# Patient Record
Sex: Male | Born: 1990 | Hispanic: Yes | Smoking: Light tobacco smoker
Health system: Southern US, Community
[De-identification: ages and names within clinical notes are randomized; demographics above are authoritative.]

## PROBLEM LIST (undated history)

## (undated) DIAGNOSIS — F1110 Opioid abuse, uncomplicated: Secondary | ICD-10-CM

## (undated) DIAGNOSIS — I639 Cerebral infarction, unspecified: Secondary | ICD-10-CM

## (undated) DIAGNOSIS — T50901A Poisoning by unspecified drugs, medicaments and biological substances, accidental (unintentional), initial encounter: Secondary | ICD-10-CM

## (undated) DIAGNOSIS — M21372 Foot drop, left foot: Secondary | ICD-10-CM

---

## 2016-12-16 DIAGNOSIS — M21372 Foot drop, left foot: Secondary | ICD-10-CM

## 2016-12-16 DIAGNOSIS — T50901A Poisoning by unspecified drugs, medicaments and biological substances, accidental (unintentional), initial encounter: Secondary | ICD-10-CM

## 2016-12-16 HISTORY — DX: Poisoning by unspecified drugs, medicaments and biological substances, accidental (unintentional), initial encounter: T50.901A

## 2016-12-16 HISTORY — DX: Foot drop, left foot: M21.372

## 2018-09-04 ENCOUNTER — Emergency Department (HOSPITAL_COMMUNITY): Payer: Self-pay

## 2018-09-04 ENCOUNTER — Emergency Department (HOSPITAL_COMMUNITY)
Admission: EM | Admit: 2018-09-04 | Discharge: 2018-09-04 | Disposition: A | Discharge status: Home / Self Care | Payer: Self-pay | Attending: Emergency Medicine | Admitting: Emergency Medicine

## 2018-09-04 ENCOUNTER — Encounter (HOSPITAL_COMMUNITY): Payer: Self-pay

## 2018-09-04 ENCOUNTER — Other Ambulatory Visit: Payer: Self-pay

## 2018-09-04 DIAGNOSIS — M19072 Primary osteoarthritis, left ankle and foot: Secondary | ICD-10-CM | POA: Insufficient documentation

## 2018-09-04 DIAGNOSIS — Z87891 Personal history of nicotine dependence: Secondary | ICD-10-CM | POA: Insufficient documentation

## 2018-09-04 HISTORY — DX: Foot drop, left foot: M21.372

## 2018-09-04 HISTORY — DX: Poisoning by unspecified drugs, medicaments and biological substances, accidental (unintentional), initial encounter: T50.901A

## 2018-09-04 LAB — CBC WITH DIFFERENTIAL/PLATELET
ABS IMMATURE GRANULOCYTES: 0 10*3/uL (ref 0.0–0.1)
BASOS PCT: 0 %
Basophils Absolute: 0 10*3/uL (ref 0.0–0.1)
Eosinophils Absolute: 0.8 10*3/uL — ABNORMAL HIGH (ref 0.0–0.7)
Eosinophils Relative: 12 %
HEMATOCRIT: 38.9 % — AB (ref 39.0–52.0)
Hemoglobin: 12.7 g/dL — ABNORMAL LOW (ref 13.0–17.0)
Immature Granulocytes: 0 %
LYMPHS ABS: 1.2 10*3/uL (ref 0.7–4.0)
Lymphocytes Relative: 19 %
MCH: 30 pg (ref 26.0–34.0)
MCHC: 32.6 g/dL (ref 30.0–36.0)
MCV: 92 fL (ref 78.0–100.0)
MONO ABS: 0.5 10*3/uL (ref 0.1–1.0)
MONOS PCT: 8 %
NEUTROS ABS: 4 10*3/uL (ref 1.7–7.7)
Neutrophils Relative %: 61 %
PLATELETS: 335 10*3/uL (ref 150–400)
RBC: 4.23 MIL/uL (ref 4.22–5.81)
RDW: 12.5 % (ref 11.5–15.5)
WBC: 6.5 10*3/uL (ref 4.0–10.5)

## 2018-09-04 LAB — COMPREHENSIVE METABOLIC PANEL
ALBUMIN: 3.4 g/dL — AB (ref 3.5–5.0)
ALK PHOS: 73 U/L (ref 38–126)
ALT: 17 U/L (ref 0–44)
ANION GAP: 7 (ref 5–15)
AST: 23 U/L (ref 15–41)
BUN: <5 mg/dL — ABNORMAL LOW (ref 6–20)
CALCIUM: 8.8 mg/dL — AB (ref 8.9–10.3)
CO2: 28 mmol/L (ref 22–32)
CREATININE: 0.57 mg/dL — AB (ref 0.61–1.24)
Chloride: 104 mmol/L (ref 98–111)
GFR calc Af Amer: >60 mL/min (ref >60)
GFR calc non Af Amer: >60 mL/min (ref >60)
GLUCOSE: 86 mg/dL (ref 70–99)
Potassium: 3.1 mmol/L — ABNORMAL LOW (ref 3.5–5.1)
SODIUM: 139 mmol/L (ref 135–145)
Total Bilirubin: 0.5 mg/dL (ref 0.3–1.2)
Total Protein: 6.2 g/dL — ABNORMAL LOW (ref 6.5–8.1)

## 2018-09-04 LAB — I-STAT CG4 LACTIC ACID, ED: Lactic Acid, Venous: 1.06 mmol/L (ref 0.5–1.9)

## 2018-09-04 MED ORDER — PREDNISONE 20 MG PO TABS: 20.0000 mg | ORAL_TABLET | Freq: Two times a day (BID) | ORAL | 0 refills | Status: DC

## 2018-09-04 NOTE — ED Notes (Signed)
Signature pad not working. Pt ambulatory and no s/sx distress noted

## 2018-09-04 NOTE — ED Triage Notes (Signed)
Pt c/o left foot swelling X1 week. Foot is red and warm to touch, denies injury, no visible trauma seen. Pt has strong pulses in left foot but reports numbness and foot drop X1 year as result of an OD. Pt endorse still using opiates, snorts, last IVDU in right arm X3 weeks ago.

## 2018-09-04 NOTE — Discharge Instructions (Signed)
Use the walking boot as needed for comfort.  Call the orthopedic doctor or podiatrist, for follow-up appointment to arrange for further treatment in 1 or 2 weeks.  After the prednisone runs out, use ibuprofen 400 mg 3 times a day as needed for pain.  You will also find that elevating the left foot above your heart and using heat on it several times a day will be beneficial.

## 2018-09-04 NOTE — ED Notes (Signed)
Transported to xray 

## 2018-09-04 NOTE — ED Provider Notes (Signed)
[facility] EMERGENCY DEPARTMENT Provider Note   CSN: [id number] Arrival date & time: 09/04/18  1150     History   Chief Complaint Chief Complaint  Patient presents with  . Leg Pain    HPI George Li is a 27 y.o. male.  HPI   He presents for evaluation of left foot swelling, present for 1 week.  The swelling is worsening and he has increasing trouble walking.  He denies use of intravenous drugs at this time.  He denies known trauma.  He has a chronic leg weakness secondary to an overdose, which resulted in left-sided weakness.  Currently he is working.  He wears a ankle brace on his left foot for "foot drop."  He denies fever, chills, nausea, vomiting, cough, shortness of breath or chest pain.  There are no other known modifying factors.  Past Medical History:  Diagnosis Date  . Foot drop, left 2018  . OD (overdose of drug) 2018    There are no active problems to display for this patient.   History reviewed. No pertinent surgical history.      Home Medications    Prior to Admission medications   Not on File    Family History No family history on file.  Social History Social History   Tobacco Use  . Smoking status: Former Smoker    Last attempt to quit: 08/04/2018    Years since quitting: 0.0  . Smokeless tobacco: Never Used  Substance Use Topics  . Alcohol use: Never    Frequency: Never  . Drug use: Yes    Types: IV    Comment: opiates      Allergies   Patient has no known allergies.   Review of Systems Review of Systems  All other systems reviewed and are negative.    Physical Exam Updated Vital Signs BP 140/87   Pulse 76   Temp 98.2 F (36.8 C) (Oral)   Resp 16   Ht 5\' 6"  (1.676 m)   Wt 61.2 kg   SpO2 100%   BMI 21.79 kg/m   Physical Exam  Constitutional: He is oriented to person, place, and time. He appears well-developed and well-nourished.  HENT:  Head: Normocephalic and atraumatic.  Right Ear:  External ear normal.  Left Ear: External ear normal.  Eyes: Pupils are equal, round, and reactive to light. Conjunctivae and EOM are normal.  Neck: Normal range of motion and phonation normal. Neck supple.  Cardiovascular: Normal rate.  Pulmonary/Chest: Effort normal. He exhibits no bony tenderness.  Musculoskeletal:  Left forefoot and midfoot moderately swollen with redness.  No proximal streaking.  Neurovascularly intact distally in the toes of the left foot.  Neurological: He is alert and oriented to person, place, and time. No cranial nerve deficit or sensory deficit. He exhibits normal muscle tone. Coordination normal.  Skin: Skin is warm, dry and intact.  Psychiatric: He has a normal mood and affect. His behavior is normal. Judgment and thought content normal.  Nursing note and vitals reviewed.    ED Treatments / Results  Labs (all labs ordered are listed, but only abnormal results are displayed) Labs Reviewed  COMPREHENSIVE METABOLIC PANEL - Abnormal; Notable for the following components:      Result Value   Potassium 3.1 (*)    BUN <5 (*)    Creatinine, Ser 0.57 (*)    Calcium 8.8 (*)    Total Protein 6.2 (*)    Albumin 3.4 (*)  All other components within normal limits  CBC WITH DIFFERENTIAL/PLATELET - Abnormal; Notable for the following components:   Hemoglobin 12.7 (*)    HCT 38.9 (*)    Eosinophils Absolute 0.8 (*)    All other components within normal limits  I-STAT CG4 LACTIC ACID, ED  I-STAT CG4 LACTIC ACID, ED    EKG None  Radiology Dg Foot Complete Left  Result Date: 09/04/2018 CLINICAL DATA:  Redness, swelling, and warmth to touch. No known injury. EXAM: LEFT FOOT - COMPLETE 3+ VIEW COMPARISON:  None. FINDINGS: There is slight deformity of the base of the first metatarsal consistent with prior remote trauma. 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 There also slight arthritic  changes of the first MTP joint. There is soft tissue swelling of the forefoot, nonspecific. IMPRESSION: 1. No acute bone abnormality. Chronic changes in the navicular, base of the first metatarsal, and first MTP joint. 2. Nonspecific soft tissue swelling of the forefoot. Electronically Signed   By: Francene BoyersJames  Maxwell M.D.   On: 09/04/2018 12:46    Procedures Procedures (including critical care time)  Medications Ordered in ED Medications - No data to display   Initial Impression / Assessment and Plan / ED Course  I have reviewed the triage vital signs and the nursing notes.  Pertinent labs & imaging results that were available during my care of the patient were reviewed by me and considered in my medical decision making (see chart for details).      Patient Vitals for the past 24 hrs:  BP Temp Temp src Pulse Resp SpO2 Height Weight  09/04/18 1159 - - - - - - 5\' 6"  (1.676 m) 61.2 kg  09/04/18 1157 140/87 98.2 F (36.8 C) Oral 76 16 100 % - -    2:50 PM Reevaluation with update and discussion. After initial assessment and treatment, an updated evaluation reveals no change in clinical status.  Findings discussed with the patient and all questions were answered. Mancel BaleElliott Staisha Winiarski   Medical Decision Making: Left foot pain and swelling, with radiologic imaging consistent with localized arthritis at the midfoot and TP joints, medially.  Doubt fracture, cellulitis, tendinitis or lymphangitis.  Doubt septic arthritis etiology of the arthritis is not clear.  Patient treated with cam walker to support ambulation, preventing foot movement and helping with foot drop.  Recommendation for follow-up with podiatry or orthopedics has been made by me.  CRITICAL CARE-no Performed by: Mancel BaleElliott Dawnita Molner   Nursing Notes Reviewed/ Care Coordinated Applicable Imaging Reviewed Interpretation of Laboratory Data incorporated into ED treatment  The patient appears reasonably screened and/or stabilized for discharge and I  doubt any other medical condition or other [facility] requiring further screening, evaluation, or treatment in the ED at this time prior to discharge.  Plan: Home Medications-OTC anti-inflammatory medication, after prednisone prescription; Home Treatments-rest, elevation, cam walker; return here if the recommended treatment, does not improve the symptoms; Recommended follow up-podiatry or orthopedic follow-up 1 to 2 weeks    Final Clinical Impressions(s) / ED Diagnoses   Final diagnoses:  Arthritis of midtarsal joint of left foot    ED Discharge Orders    None       Mancel BaleWentz, Caige Almeda, MD 09/06/18 1007

## 2019-01-27 ENCOUNTER — Other Ambulatory Visit: Payer: Self-pay

## 2019-01-27 ENCOUNTER — Encounter (HOSPITAL_COMMUNITY): Payer: Self-pay | Admitting: Emergency Medicine

## 2019-01-27 ENCOUNTER — Emergency Department (HOSPITAL_COMMUNITY)
Admission: EM | Admit: 2019-01-27 | Discharge: 2019-01-28 | Disposition: A | Discharge status: Home / Self Care | Payer: Self-pay | Attending: Emergency Medicine | Admitting: Emergency Medicine

## 2019-01-27 DIAGNOSIS — T401X1A Poisoning by heroin, accidental (unintentional), initial encounter: Secondary | ICD-10-CM | POA: Insufficient documentation

## 2019-01-27 DIAGNOSIS — Z87891 Personal history of nicotine dependence: Secondary | ICD-10-CM | POA: Insufficient documentation

## 2019-01-27 HISTORY — DX: Opioid abuse, uncomplicated: F11.10
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
## 2019-01-28 ENCOUNTER — Encounter (HOSPITAL_COMMUNITY): Payer: Self-pay
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
## 2019-01-28 NOTE — ED Notes (Addendum)
Pt requesting to leave. MD aware and at bedside. Pt's vitals are stable and pt alert. Pt verbalized importance of not using drugs and appropriate follow up care.

## 2019-04-02 ENCOUNTER — Emergency Department (HOSPITAL_COMMUNITY)
Admission: EM | Admit: 2019-04-02 | Discharge: 2019-04-03 | Disposition: A | Discharge status: Home / Self Care | Payer: Self-pay | Attending: Emergency Medicine | Admitting: Emergency Medicine

## 2019-04-02 ENCOUNTER — Emergency Department (HOSPITAL_COMMUNITY): Payer: Self-pay

## 2019-04-02 ENCOUNTER — Other Ambulatory Visit: Payer: Self-pay

## 2019-04-02 ENCOUNTER — Encounter (HOSPITAL_COMMUNITY): Payer: Self-pay | Admitting: Emergency Medicine

## 2019-04-02 DIAGNOSIS — Y9301 Activity, walking, marching and hiking: Secondary | ICD-10-CM | POA: Insufficient documentation

## 2019-04-02 DIAGNOSIS — X58XXXA Exposure to other specified factors, initial encounter: Secondary | ICD-10-CM | POA: Insufficient documentation

## 2019-04-02 DIAGNOSIS — F17210 Nicotine dependence, cigarettes, uncomplicated: Secondary | ICD-10-CM | POA: Insufficient documentation

## 2019-04-02 DIAGNOSIS — Y999 Unspecified external cause status: Secondary | ICD-10-CM | POA: Insufficient documentation

## 2019-04-02 DIAGNOSIS — S92352A Displaced fracture of fifth metatarsal bone, left foot, initial encounter for closed fracture: Secondary | ICD-10-CM | POA: Insufficient documentation

## 2019-04-02 DIAGNOSIS — Y929 Unspecified place or not applicable: Secondary | ICD-10-CM | POA: Insufficient documentation

## 2019-04-02 NOTE — ED Triage Notes (Addendum)
Pt here for eval of injury to left foot with prior hx of injury/parasthesia from 1 year ago. States he was walking and injured his left foot on a curb today. Pt has redness and swelling to his left foot/ankle. Pt states he cannot move his toes.

## 2019-04-03 MED ORDER — IBUPROFEN 400 MG PO TABS
400.0000 mg | ORAL_TABLET | Freq: Once | ORAL | Status: AC
  Administered 2019-04-03: 400 mg via ORAL
  Filled 2019-04-03: qty 1

## 2019-04-03 NOTE — Progress Notes (Signed)
Orthopedic Tech Progress Note Patient Details:  George Li November 19, 1991 [id number]  Ortho Devices Type of Ortho Device: Post (short leg) splint, Crutches Ortho Device/Splint Interventions: Adjustment, Application, Ordered   Post Interventions Patient Tolerated: Well Instructions Provided: Poper ambulation with device, Care of device, Adjustment of device   Norva Karvonen T 04/03/2019, 12:42 AM

## 2019-04-03 NOTE — ED Notes (Signed)
Ortho tech called and answered   He will be down

## 2019-04-03 NOTE — ED Provider Notes (Signed)
[facility] EMERGENCY DEPARTMENT Provider Note   CSN: [id number] Arrival date & time: 04/02/19  6244    History   Chief Complaint Chief Complaint  Patient presents with  . Foot Injury    HPI George Li is a 28 y.o. male.     The history is provided by the patient.  Foot Injury  Location:  Foot Injury: yes   Foot location:  L foot Pain details:    Quality:  Aching   Radiates to:  Does not radiate   Severity:  Severe   Onset quality:  Sudden   Timing:  Constant   Progression:  Worsening Chronicity:  New Relieved by:  Rest Worsened by:  Bearing weight Associated symptoms: no back pain and no fever    Patient with reported history of left foot drop and neuropathy presents with left foot injury.  He reports he stepped off the curb wrong and hurt his foot He reports his foot is now swollen and painful. No other acute injuries.  He does admit to  previous IVDA for heroin.  Denies injecting his foot Past Medical History:  Diagnosis Date  . Foot drop, left 2018  . Heroin abuse (HCC)   . OD (overdose of drug) 2018    There are no active problems to display for this patient.   History reviewed. No pertinent surgical history.      Home Medications    Prior to Admission medications   Medication Sig Start Date End Date Taking? Authorizing Provider  predniSONE (DELTASONE) 20 MG tablet Take 1 tablet (20 mg total) by mouth 2 (two) times daily. 09/04/18   Mancel Bale, MD    Family History No family history on file.  Social History Social History   Tobacco Use  . Smoking status: Light Tobacco Smoker  . Smokeless tobacco: Never Used  Substance Use Topics  . Alcohol use: Not Currently    Frequency: Never  . Drug use: Yes    Types: Methamphetamines, IV    Comment: opiates      Allergies   Patient has no known allergies.   Review of Systems Review of Systems  Constitutional: Negative for fever.  Respiratory:  Negative for cough and shortness of breath.   Musculoskeletal: Positive for arthralgias and joint swelling. Negative for back pain.  All other systems reviewed and are negative.    Physical Exam Updated Vital Signs BP (!) 134/91 (BP Location: Right Arm)   Pulse (!) 119   Temp 98.9 F (37.2 C) (Oral)   Resp 16   SpO2 100%   Physical Exam CONSTITUTIONAL: Well developed/well nourished HEAD: Normocephalic/atraumatic EYES: EOMI NECK: supple no meningeal signs CV: S1/S2 noted, no murmurs/rubs/gallops noted LUNGS: Lungs are clear to auscultation bilaterally, no apparent distress ABDOMEN: soft, nontender, no rebound or guarding, bowel sounds noted throughout abdomen GU:no cva tenderness NEURO: Pt is awake/alert/appropriate, moves all extremitiesx4.  No facial droop.   EXTREMITIES: pulses normal/equal, full ROM, tenderness and swelling noted to left foot.  Distal pulses intact.  No lacerations.  No puncture wounds or injection sites noted on left foot. There is no left knee tenderness. SKIN: warm, color normal PSYCH: no abnormalities of mood noted, alert and oriented to situation   ED Treatments / Results  Labs (all labs ordered are listed, but only abnormal results are displayed) Labs Reviewed - No data to display  EKG None  Radiology Dg Ankle Complete Left  Result Date: 04/02/2019 CLINICAL DATA:  Recent trip  and fall with left ankle pain, initial encounter EXAM: LEFT ANKLE COMPLETE - 3+ VIEW COMPARISON:  None. FINDINGS: Mild soft tissue swelling is noted laterally. The comminuted fracture at the base of the fifth metatarsal is again identified. No new focal abnormality is seen. IMPRESSION: Fracture at the base of the fifth metatarsal. No other acute abnormality is noted. Electronically Signed   By: Alcide CleverMark  Lukens M.D.   On: 04/02/2019 20:58   Dg Foot Complete Left  Result Date: 04/02/2019 CLINICAL DATA:  Recent trip and fall with foot and ankle pain, initial encounter EXAM: LEFT  FOOT - COMPLETE 3+ VIEW COMPARISON:  09/04/2018 FINDINGS: There is a comminuted fracture at the base of the fifth metatarsal. Additionally fractures are noted in the first and second proximal phalanges as well as the head of the third metatarsal with mild callus formation consistent with healing fractures. Irregularity at the base of the first metatarsal is noted related to the prior fracture seen on the previous exam. No other acute abnormality is noted. IMPRESSION: Acute comminuted fracture at the base of the fifth metatarsal. Chronic healing fractures involving the first and second proximal phalanges as well as the third metatarsal head. Electronically Signed   By: Alcide CleverMark  Lukens M.D.   On: 04/02/2019 20:57    Procedures Procedures    Medications Ordered in ED Medications  ibuprofen (ADVIL) tablet 400 mg (has no administration in time range)   SPLINT APPLICATION Date/Time: 1:09 AM Authorized by: Joya Gaskinsonald W Jaydn Fincher Consent: Verbal consent obtained. Risks and benefits: risks, benefits and alternatives were discussed Consent given by: patient Splint applied by: orthopedic technician Location details: left foot Splint type: posterior Supplies used: ortho glass Post-procedure: The splinted body part was neurovascularly unchanged following the procedure. Patient tolerance: Patient tolerated the procedure well with no immediate complications.      Initial Impression / Assessment and Plan / ED Course  I have reviewed the triage vital signs and the nursing notes.  Pertinent  imaging results that were available during my care of the patient were reviewed by me and considered in my medical decision making (see chart for details).       1:10 AM Imaging reveals patient is already had previous injuries to the same foot.  He now has an acute fifth metatarsal fracture.  X-ray was reviewed. He was placed in a splint with crutches and referred to orthopedics next week. He is requesting ibuprofen  for pain.  Final Clinical Impressions(s) / ED Diagnoses   Final diagnoses:  Closed displaced fracture of fifth metatarsal bone of left foot, initial encounter    ED Discharge Orders    None       Zadie RhineWickline, Donat Humble, MD 04/03/19 0110

## 2019-04-03 NOTE — ED Notes (Signed)
The pt  Is c/o lt foot pain and swelling since he twisted it yesterday stepping off a curb

## 2020-04-01 ENCOUNTER — Emergency Department (HOSPITAL_COMMUNITY): Payer: No Typology Code available for payment source

## 2020-04-01 ENCOUNTER — Emergency Department (HOSPITAL_COMMUNITY)
Admission: EM | Admit: 2020-04-01 | Discharge: 2020-04-02 | Disposition: A | Discharge status: Home / Self Care | Payer: No Typology Code available for payment source | Attending: Emergency Medicine | Admitting: Emergency Medicine

## 2020-04-01 ENCOUNTER — Encounter (HOSPITAL_COMMUNITY): Payer: Self-pay | Admitting: Emergency Medicine

## 2020-04-01 ENCOUNTER — Other Ambulatory Visit: Payer: Self-pay

## 2020-04-01 DIAGNOSIS — M545 Low back pain: Secondary | ICD-10-CM | POA: Diagnosis not present

## 2020-04-01 DIAGNOSIS — M546 Pain in thoracic spine: Secondary | ICD-10-CM | POA: Diagnosis not present

## 2020-04-01 DIAGNOSIS — F17200 Nicotine dependence, unspecified, uncomplicated: Secondary | ICD-10-CM | POA: Insufficient documentation

## 2020-04-01 DIAGNOSIS — M542 Cervicalgia: Secondary | ICD-10-CM | POA: Insufficient documentation

## 2020-04-01 DIAGNOSIS — Y939 Activity, unspecified: Secondary | ICD-10-CM | POA: Insufficient documentation

## 2020-04-01 DIAGNOSIS — Z8673 Personal history of transient ischemic attack (TIA), and cerebral infarction without residual deficits: Secondary | ICD-10-CM | POA: Insufficient documentation

## 2020-04-01 DIAGNOSIS — R55 Syncope and collapse: Secondary | ICD-10-CM | POA: Diagnosis not present

## 2020-04-01 DIAGNOSIS — Y92410 Unspecified street and highway as the place of occurrence of the external cause: Secondary | ICD-10-CM | POA: Diagnosis not present

## 2020-04-01 DIAGNOSIS — R0789 Other chest pain: Secondary | ICD-10-CM | POA: Insufficient documentation

## 2020-04-01 DIAGNOSIS — Z23 Encounter for immunization: Secondary | ICD-10-CM | POA: Diagnosis not present

## 2020-04-01 DIAGNOSIS — S0181XA Laceration without foreign body of other part of head, initial encounter: Secondary | ICD-10-CM | POA: Diagnosis not present

## 2020-04-01 DIAGNOSIS — R109 Unspecified abdominal pain: Secondary | ICD-10-CM | POA: Diagnosis not present

## 2020-04-01 DIAGNOSIS — Y999 Unspecified external cause status: Secondary | ICD-10-CM | POA: Diagnosis not present

## 2020-04-01 HISTORY — DX: Cerebral infarction, unspecified: I63.9

## 2020-04-01 LAB — CBC
HCT: 39.1 % (ref 39.0–52.0)
Hemoglobin: 13.1 g/dL (ref 13.0–17.0)
MCH: 29.7 pg (ref 26.0–34.0)
MCHC: 33.5 g/dL (ref 30.0–36.0)
MCV: 88.7 fL (ref 80.0–100.0)
Platelets: 306 10*3/uL (ref 150–400)
RBC: 4.41 MIL/uL (ref 4.22–5.81)
RDW: 13.9 % (ref 11.5–15.5)
WBC: 11.8 10*3/uL — ABNORMAL HIGH (ref 4.0–10.5)
nRBC: 0 % (ref 0.0–0.2)

## 2020-04-01 LAB — BASIC METABOLIC PANEL
Anion gap: 9 (ref 5–15)
BUN: 15 mg/dL (ref 6–20)
CO2: 27 mmol/L (ref 22–32)
Calcium: 9.1 mg/dL (ref 8.9–10.3)
Chloride: 103 mmol/L (ref 98–111)
Creatinine, Ser: 0.69 mg/dL (ref 0.61–1.24)
GFR calc Af Amer: >60 mL/min (ref >60)
GFR calc non Af Amer: >60 mL/min (ref >60)
Glucose, Bld: 119 mg/dL — ABNORMAL HIGH (ref 70–99)
Potassium: 3.9 mmol/L (ref 3.5–5.1)
Sodium: 139 mmol/L (ref 135–145)

## 2020-04-01 LAB — ETHANOL: Alcohol, Ethyl (B): <10 mg/dL (ref <10)

## 2020-04-01 LAB — TYPE AND SCREEN
ABO/RH(D): A POS
Antibody Screen: NEGATIVE

## 2020-04-01 LAB — ABO/RH: ABO/RH(D): A POS

## 2020-04-01 MED ORDER — IOHEXOL 300 MG/ML  SOLN
100.0000 mL | Freq: Once | INTRAMUSCULAR | Status: AC | PRN
  Administered 2020-04-01: 23:00:00 100 mL via INTRAVENOUS

## 2020-04-01 MED ORDER — TETANUS-DIPHTH-ACELL PERTUSSIS 5-2.5-18.5 LF-MCG/0.5 IM SUSP
0.5000 mL | Freq: Once | INTRAMUSCULAR | Status: AC
  Administered 2020-04-02: 0.5 mL via INTRAMUSCULAR
  Filled 2020-04-01: qty 0.5

## 2020-04-01 MED ORDER — LIDOCAINE-EPINEPHRINE (PF) 2 %-1:200000 IJ SOLN
10.0000 mL | Freq: Once | INTRAMUSCULAR | Status: AC
  Administered 2020-04-02: 10 mL
  Filled 2020-04-01: qty 20

## 2020-04-01 MED ORDER — MORPHINE SULFATE (PF) 4 MG/ML IV SOLN: 4.0000 mg | Freq: Once | INTRAVENOUS | Status: DC

## 2020-04-01 MED ORDER — SODIUM CHLORIDE 0.9 % IV SOLN
1000.0000 mL | INTRAVENOUS | Status: DC
  Administered 2020-04-02: 1000 mL via INTRAVENOUS

## 2020-04-01 MED ORDER — SODIUM CHLORIDE 0.9 % IV BOLUS (SEPSIS)
1000.0000 mL | Freq: Once | INTRAVENOUS | Status: AC
  Administered 2020-04-02: 1000 mL via INTRAVENOUS

## 2020-04-01 MED ORDER — BACITRACIN ZINC 500 UNIT/GM EX OINT
1.0000 "application " | TOPICAL_OINTMENT | Freq: Once | CUTANEOUS | Status: AC
  Administered 2020-04-02: 1 via TOPICAL
  Filled 2020-04-01: qty 0.9

## 2020-04-01 MED ORDER — SODIUM CHLORIDE (PF) 0.9 % IJ SOLN
INTRAMUSCULAR | Status: AC
  Filled 2020-04-01: qty 50

## 2020-04-01 NOTE — ED Notes (Signed)
Pt called for triage the screeners stated to this writer that he was in the bathroom

## 2020-04-01 NOTE — ED Notes (Signed)
Called for patient again and was still in the bathroom this writer went to check on him in the bathroom when I asked if he was ok the patient responded and stated that he was ok.
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
## 2020-04-01 NOTE — ED Notes (Signed)
C-collar placed on pt.
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
## 2020-04-01 NOTE — Discharge Instructions (Signed)
The sutures are absorbable.  Make sure to apply antibiotic ointment to the wound daily.  Take over the counter medications as needed for pain

## 2020-07-02 ENCOUNTER — Other Ambulatory Visit: Payer: Self-pay

## 2020-07-02 ENCOUNTER — Emergency Department (HOSPITAL_COMMUNITY)
Admission: EM | Admit: 2020-07-02 | Discharge: 2020-07-02 | Disposition: A | Discharge status: Home / Self Care | Attending: Emergency Medicine | Admitting: Emergency Medicine

## 2020-07-02 ENCOUNTER — Encounter (HOSPITAL_COMMUNITY): Payer: Self-pay | Admitting: Emergency Medicine

## 2020-07-02 ENCOUNTER — Emergency Department (HOSPITAL_COMMUNITY)

## 2020-07-02 DIAGNOSIS — F17200 Nicotine dependence, unspecified, uncomplicated: Secondary | ICD-10-CM | POA: Insufficient documentation

## 2020-07-02 DIAGNOSIS — F1910 Other psychoactive substance abuse, uncomplicated: Secondary | ICD-10-CM | POA: Insufficient documentation

## 2020-07-02 DIAGNOSIS — Z20822 Contact with and (suspected) exposure to covid-19: Secondary | ICD-10-CM | POA: Diagnosis not present

## 2020-07-02 DIAGNOSIS — R462 Strange and inexplicable behavior: Secondary | ICD-10-CM | POA: Diagnosis present

## 2020-07-02 DIAGNOSIS — R000 Tachycardia, unspecified: Secondary | ICD-10-CM | POA: Diagnosis not present

## 2020-07-02 DIAGNOSIS — R569 Unspecified convulsions: Secondary | ICD-10-CM | POA: Insufficient documentation

## 2020-07-02 DIAGNOSIS — F19930 Other psychoactive substance use, unspecified with withdrawal, uncomplicated: Secondary | ICD-10-CM

## 2020-07-02 LAB — COMPREHENSIVE METABOLIC PANEL
ALT: 73 U/L — ABNORMAL HIGH (ref 0–44)
AST: 62 U/L — ABNORMAL HIGH (ref 15–41)
Albumin: 3.8 g/dL (ref 3.5–5.0)
Alkaline Phosphatase: 76 U/L (ref 38–126)
Anion gap: 10 (ref 5–15)
BUN: 13 mg/dL (ref 6–20)
CO2: 22 mmol/L (ref 22–32)
Calcium: 9 mg/dL (ref 8.9–10.3)
Chloride: 107 mmol/L (ref 98–111)
Creatinine, Ser: 0.73 mg/dL (ref 0.61–1.24)
GFR calc Af Amer: >60 mL/min (ref >60)
GFR calc non Af Amer: >60 mL/min (ref >60)
Glucose, Bld: 127 mg/dL — ABNORMAL HIGH (ref 70–99)
Potassium: 3.9 mmol/L (ref 3.5–5.1)
Sodium: 139 mmol/L (ref 135–145)
Total Bilirubin: 0.8 mg/dL (ref 0.3–1.2)
Total Protein: 7.2 g/dL (ref 6.5–8.1)

## 2020-07-02 LAB — LIPASE, BLOOD: Lipase: 34 U/L (ref 11–51)

## 2020-07-02 LAB — URINALYSIS, ROUTINE W REFLEX MICROSCOPIC
Bilirubin Urine: NEGATIVE
Glucose, UA: 150 mg/dL — AB
Hgb urine dipstick: NEGATIVE
Ketones, ur: NEGATIVE mg/dL
Leukocytes,Ua: NEGATIVE
Nitrite: NEGATIVE
Protein, ur: NEGATIVE mg/dL
Specific Gravity, Urine: 1.024 (ref 1.005–1.030)
pH: 6 (ref 5.0–8.0)

## 2020-07-02 LAB — CBC
HCT: 41.6 % (ref 39.0–52.0)
Hemoglobin: 14.5 g/dL (ref 13.0–17.0)
MCH: 30.5 pg (ref 26.0–34.0)
MCHC: 34.9 g/dL (ref 30.0–36.0)
MCV: 87.6 fL (ref 80.0–100.0)
Platelets: 287 10*3/uL (ref 150–400)
RBC: 4.75 MIL/uL (ref 4.22–5.81)
RDW: 13.5 % (ref 11.5–15.5)
WBC: 7.3 10*3/uL (ref 4.0–10.5)
nRBC: 0 % (ref 0.0–0.2)

## 2020-07-02 LAB — RAPID URINE DRUG SCREEN, HOSP PERFORMED
Amphetamines: POSITIVE — AB
Barbiturates: NOT DETECTED
Benzodiazepines: NOT DETECTED
Cocaine: NOT DETECTED
Opiates: NOT DETECTED
Tetrahydrocannabinol: NOT DETECTED

## 2020-07-02 LAB — SARS CORONAVIRUS 2 BY RT PCR (HOSPITAL ORDER, PERFORMED IN ~~LOC~~ HOSPITAL LAB): SARS Coronavirus 2: NEGATIVE

## 2020-07-02 LAB — ACETAMINOPHEN LEVEL: Acetaminophen (Tylenol), Serum: <10 ug/mL — ABNORMAL LOW (ref 10–30)

## 2020-07-02 LAB — CBG MONITORING, ED: Glucose-Capillary: 80 mg/dL (ref 70–99)

## 2020-07-02 LAB — SALICYLATE LEVEL: Salicylate Lvl: <7 mg/dL — ABNORMAL LOW (ref 7.0–30.0)

## 2020-07-02 LAB — ETHANOL: Alcohol, Ethyl (B): <10 mg/dL (ref <10)

## 2020-07-02 MED ORDER — SODIUM CHLORIDE 0.9 % IV BOLUS
1000.0000 mL | Freq: Once | INTRAVENOUS | Status: AC
  Administered 2020-07-02: 1000 mL via INTRAVENOUS

## 2020-07-02 MED ORDER — LORAZEPAM 2 MG/ML IJ SOLN
2.0000 mg | Freq: Once | INTRAMUSCULAR | Status: AC
  Administered 2020-07-02: 2 mg via INTRAMUSCULAR
  Filled 2020-07-02: qty 1
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
## 2020-07-02 NOTE — Discharge Instructions (Addendum)
It was our pleasure to provide your ER care today - we hope that you feel better.  Drink plenty of fluids, eat balanced diet.   Avoid drug abuse - see resource guide for community rehab/treatment options.   Also follow up with primary care doctor/neurologist in the next couple weeks. No driving or operating heavy machinery until cleared to do so by your doctor/neurologist.   Return to ER if worse, new symptoms, fevers, trouble breathing, persistent vomiting, new or severe pain, recurrent seizures, or other concern.

## 2020-07-02 NOTE — BH Assessment (Signed)
Behavior Health Note:  Patient is currently in police custody.  TTS questioned the need for a Behavioral Health Assessment for someone in police custody that could not be placed in a psych facility if needed.  TTS contacted the [facility] to see if patient required a behavioral health assessment prior to returning to jail.  TTS was transferred to booking and TTS spoke to the officer there who indicated that patient was given narcan in the jail because they thought he was withdrawing from drugs or alcohol and their protocol is that when narcan is administered that the inmate needed to go to the hospital for medical clearance.  He stated that he was not aware of any need for patient to have a mental health evaluation.  Patient has first appearance in court tomorrow morning.
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
## 2020-07-02 NOTE — ED Provider Notes (Addendum)
[facility]-EMERGENCY DEPT Provider Note   CSN: [id number] Arrival date & time: 07/02/20  1334     History Chief Complaint  Patient presents with  . Altered Mental Status    George Li is a 29 y.o. male.  Patient presents from jail with odd/strange behavior, and decreased verbal responsiveness in the past day, for possible psychiatry evaluation. Staff from facility indicate he has been there two days. Pt is awake, and alert, poorly cooperative with history, does not respond to most questions asked - level 5 caveat. At one point, appears to answer yes when questioning about abd pain, but provides no additional information. Hx substance abuse noted in chart - pt does not answer questions related to any recent drug use or ingestion. CBG 80. No report of fevers. No report of trauma/fall/injury.   The history is provided by the patient and the police. The history is limited by the condition of the patient.       Past Medical History:  Diagnosis Date  . Foot drop, left 2018  . Heroin abuse (HCC)   . OD (overdose of drug) 2018  . Stroke [facility](HCC)     There are no problems to display for this patient.   History reviewed. No pertinent surgical history.     History reviewed. No pertinent family history.  Social History   Tobacco Use  . Smoking status: Light Tobacco Smoker  . Smokeless tobacco: Never Used  Vaping Use  . Vaping Use: Never used  Substance Use Topics  . Alcohol use: Not Currently  . Drug use: Yes    Types: Methamphetamines, IV    Comment: opiates     Home Medications Prior to Admission medications   Medication Sig Start Date End Date Taking? Authorizing Provider  predniSONE (DELTASONE) 20 MG tablet Take 1 tablet (20 mg total) by mouth 2 (two) times daily. 09/04/18   Mancel BaleWentz, Elliott, MD    Allergies    Patient has no known allergies.  Review of Systems   Review of Systems  Unable to perform ROS: Mental status change    pt not cooperative w ROS - level 5 caveat    Physical Exam Updated Vital Signs BP (!) 134/93 (BP Location: Left Arm)   Pulse (!) 101   Temp 98 F (36.7 C)   Resp (!) 36   Ht 1.676 m (5\' 6" )   Wt 56.7 kg   SpO2 100%   BMI 20.18 kg/m   Physical Exam Vitals and nursing note reviewed.  Constitutional:      Appearance: Normal appearance. He is well-developed.  HENT:     Head: Atraumatic.     Nose: Nose normal.     Mouth/Throat:     Mouth: Mucous membranes are moist.     Pharynx: Oropharynx is clear.  Eyes:     General: No scleral icterus.    Conjunctiva/sclera: Conjunctivae normal.     Pupils: Pupils are equal, round, and reactive to light.  Neck:     Vascular: No carotid bruit.     Trachea: No tracheal deviation.  Cardiovascular:     Rate and Rhythm: Regular rhythm. Tachycardia present.     Pulses: Normal pulses.     Heart sounds: Normal heart sounds. No murmur heard.  No friction rub. No gallop.   Pulmonary:     Effort: Pulmonary effort is normal. No accessory muscle usage or respiratory distress.     Breath sounds: Normal breath sounds.  Abdominal:  General: Bowel sounds are normal. There is no distension.     Palpations: Abdomen is soft. There is no mass.     Tenderness: There is no abdominal tenderness. There is no guarding.  Genitourinary:    Comments: No cva tenderness. Musculoskeletal:        General: No swelling or tenderness.     Cervical back: Normal range of motion and neck supple. No rigidity.  Skin:    General: Skin is warm and dry.     Findings: No rash.  Neurological:     Mental Status: He is alert.     Comments: Sitting upright in chair, pt is awake, at times rocking back and forth and/or side to side. Moves bil extremities purposefully.   Psychiatric:     Comments: Uncooperative. Unusual affect.      ED Results / Procedures / Treatments   Labs (all labs ordered are listed, but only abnormal results are displayed) Results for orders  placed or performed during the hospital encounter of 07/02/20  SARS Coronavirus 2 by RT PCR (hospital order, performed in [facility] hospital lab) Nasopharyngeal Nasopharyngeal Swab   Specimen: Nasopharyngeal Swab  Result Value Ref Range   SARS Coronavirus 2 NEGATIVE NEGATIVE  CBC  Result Value Ref Range   WBC 7.3 4.0 - 10.5 K/uL   RBC 4.75 4.22 - 5.81 MIL/uL   Hemoglobin 14.5 13.0 - 17.0 g/dL   HCT 51.8 39 - 52 %   MCV 87.6 80.0 - 100.0 fL   MCH 30.5 26.0 - 34.0 pg   MCHC 34.9 30.0 - 36.0 g/dL   RDW 84.1 66.0 - 63.0 %   Platelets 287 150 - 400 K/uL   nRBC 0.0 0.0 - 0.2 %  Comprehensive metabolic panel  Result Value Ref Range   Sodium 139 135 - 145 mmol/L   Potassium 3.9 3.5 - 5.1 mmol/L   Chloride 107 98 - 111 mmol/L   CO2 22 22 - 32 mmol/L   Glucose, Bld 127 (H) 70 - 99 mg/dL   BUN 13 6 - 20 mg/dL   Creatinine, Ser 1.60 0.61 - 1.24 mg/dL   Calcium 9.0 8.9 - 10.9 mg/dL   Total Protein 7.2 6.5 - 8.1 g/dL   Albumin 3.8 3.5 - 5.0 g/dL   AST 62 (H) 15 - 41 U/L   ALT 73 (H) 0 - 44 U/L   Alkaline Phosphatase 76 38 - 126 U/L   Total Bilirubin 0.8 0.3 - 1.2 mg/dL   GFR calc non Af Amer >60 >60 mL/min   GFR calc Af Amer >60 >60 mL/min   Anion gap 10 5 - 15  Rapid urine drug screen (hospital performed)  Result Value Ref Range   Opiates NONE DETECTED NONE DETECTED   Cocaine NONE DETECTED NONE DETECTED   Benzodiazepines NONE DETECTED NONE DETECTED   Amphetamines POSITIVE (A) NONE DETECTED   Tetrahydrocannabinol NONE DETECTED NONE DETECTED   Barbiturates NONE DETECTED NONE DETECTED  Ethanol  Result Value Ref Range   Alcohol, Ethyl (B) <10 <10 mg/dL  Acetaminophen level  Result Value Ref Range   Acetaminophen (Tylenol), Serum <10 (L) 10 - 30 ug/mL  Salicylate level  Result Value Ref Range   Salicylate Lvl <7.0 (L) 7.0 - 30.0 mg/dL  Urinalysis, Routine w reflex microscopic  Result Value Ref Range   Color, Urine YELLOW YELLOW   APPearance CLEAR CLEAR   Specific Gravity,  Urine 1.024 1.005 - 1.030   pH 6.0 5.0 - 8.0  Glucose, UA 150 (A) NEGATIVE mg/dL   Hgb urine dipstick NEGATIVE NEGATIVE   Bilirubin Urine NEGATIVE NEGATIVE   Ketones, ur NEGATIVE NEGATIVE mg/dL   Protein, ur NEGATIVE NEGATIVE mg/dL   Nitrite NEGATIVE NEGATIVE   Leukocytes,Ua NEGATIVE NEGATIVE  Lipase, blood  Result Value Ref Range   Lipase 34 11 - 51 U/L  CBG monitoring, ED  Result Value Ref Range   Glucose-Capillary 80 70 - 99 mg/dL   CT HEAD WO CONTRAST  Result Date: 07/02/2020 CLINICAL DATA:  Patient found unresponsive today. Subsequent seizures. EXAM: CT HEAD WITHOUT CONTRAST TECHNIQUE: Contiguous axial images were obtained from the base of the skull through the vertex without intravenous contrast. COMPARISON:  Head CT 04/01/2020. FINDINGS: Brain: No evidence of acute infarction, hemorrhage, hydrocephalus, extra-axial collection or mass lesion/mass effect. Vascular: No hyperdense vessel or unexpected calcification. Skull: Normal. Negative for fracture or focal lesion. Sinuses/Orbits: Normal. Other: None. IMPRESSION: Normal head CT. Electronically Signed   By: Drusilla Kanner M.D.   On: 07/02/2020 15:48    ED ECG REPORT   Date: 07/02/2020  Rate: 112  Rhythm: sinus tachycardia  QRS Axis: right  Intervals: normal  ST/T Wave abnormalities: normal  Conduction Disutrbances:none  Narrative Interpretation:   Old EKG Reviewed: none available  I have personally reviewed the EKG tracing   Radiology No results found.  Procedures Procedures (including critical care time)  Medications Ordered in ED Medications  sodium chloride 0.9 % bolus 1,000 mL (has no administration in time range)  LORazepam (ATIVAN) injection 2 mg (has no administration in time range)    ED Course  I have reviewed the triage vital signs and the nursing notes.  Pertinent labs & imaging results that were available during my care of the patient were reviewed by me and considered in my medical decision  making (see chart for details).    MDM Rules/Calculators/A&P                          Iv ns. Labs sent. CBG 80.   Reviewed nursing notes and prior charts for additional history.   Called to room as patient with generalized tonic clonic seizure lasting 1-2 minutes - pt had been given 2 mg ativan im with resolution of seizure activity. Pt currently postictal. o2 sats 100%. No trauma w event, pt remained in stretcher chair. No incontinence or oral injury. Additional workup ordered. Seizure precautions.  Labs reviewed/interpreted by me - chem normal, wbc normal.  CT reviewed/interpreted by me - no hem.  Recheck pt, calm, alert, no distress. Pt was sent from jail for psych eval will get TTS eval.  ?possible substance abuse inducted mood disorder, vs other.   Po fluids, food. Ambulate in hall.   BH team indicates they have contacted staff at jail, discussed pt - they indicate no psych eval is needed in ED, just medical clearance.   Recheck pt, remains awake and alert. Neck supple, no stiffness or rigidity, chest cta. abd soft nt. No new c/o. No apparent pain or discomfort.  Pt has no tremor or shakes. Vitals normal. ?whether earlier sz due to substance abuse/withdrawal - no recurrent sz, remains asymptomatic, do not feel would benefit on sz med initiation currently, rec avoidance substance abuse, outpt pcp/neuro f/u.  Pt currently appears stable for d/c in custody of law enforcement.   Return precautions provided.           Final Clinical Impression(s) / ED Diagnoses Final diagnoses:  None    Rx / DC Orders ED Discharge Orders    None           Cathren Laine, MD 07/02/20 1858

## 2020-07-02 NOTE — ED Notes (Signed)
Patient offer juice and sandwich. Patient is currently eating.
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
## 2025-01-18 ENCOUNTER — Encounter (HOSPITAL_COMMUNITY): Payer: Self-pay

## 2025-01-18 ENCOUNTER — Inpatient Hospital Stay (HOSPITAL_COMMUNITY)

## 2025-01-18 ENCOUNTER — Emergency Department (HOSPITAL_COMMUNITY)

## 2025-01-18 ENCOUNTER — Inpatient Hospital Stay (HOSPITAL_COMMUNITY): Admission: EM | Admit: 2025-01-18 | Source: Home / Self Care | Admitting: General Surgery

## 2025-01-18 ENCOUNTER — Encounter (HOSPITAL_COMMUNITY): Admission: EM | Payer: Self-pay | Source: Home / Self Care

## 2025-01-18 DIAGNOSIS — S82252B Displaced comminuted fracture of shaft of left tibia, initial encounter for open fracture type I or II: Secondary | ICD-10-CM

## 2025-01-18 DIAGNOSIS — S27321A Contusion of lung, unilateral, initial encounter: Secondary | ICD-10-CM

## 2025-01-18 DIAGNOSIS — S020XXA Fracture of vault of skull, initial encounter for closed fracture: Secondary | ICD-10-CM

## 2025-01-18 DIAGNOSIS — S069XAA Unspecified intracranial injury with loss of consciousness status unknown, initial encounter: Secondary | ICD-10-CM | POA: Diagnosis present

## 2025-01-18 DIAGNOSIS — S82452B Displaced comminuted fracture of shaft of left fibula, initial encounter for open fracture type I or II: Secondary | ICD-10-CM

## 2025-01-18 DIAGNOSIS — S06340A Traumatic hemorrhage of right cerebrum without loss of consciousness, initial encounter: Secondary | ICD-10-CM

## 2025-01-18 LAB — COMPREHENSIVE METABOLIC PANEL WITH GFR
ALT: 50 U/L — ABNORMAL HIGH (ref 0–44)
AST: 62 U/L — ABNORMAL HIGH (ref 15–41)
Albumin: 3.1 g/dL — ABNORMAL LOW (ref 3.5–5.0)
Alkaline Phosphatase: 94 U/L (ref 38–126)
Anion gap: 9 (ref 5–15)
BUN: 14 mg/dL (ref 6–20)
CO2: 25 mmol/L (ref 22–32)
Calcium: 8.6 mg/dL — ABNORMAL LOW (ref 8.9–10.3)
Chloride: 102 mmol/L (ref 98–111)
Creatinine, Ser: 0.61 mg/dL (ref 0.61–1.24)
GFR, Estimated: >60 mL/min
Glucose, Bld: 138 mg/dL — ABNORMAL HIGH (ref 70–99)
Potassium: 3.6 mmol/L (ref 3.5–5.1)
Sodium: 137 mmol/L (ref 135–145)
Total Bilirubin: 0.4 mg/dL (ref 0.0–1.2)
Total Protein: 6.6 g/dL (ref 6.5–8.1)

## 2025-01-18 LAB — I-STAT CHEM 8, ED
BUN: 14 mg/dL (ref 6–20)
Calcium, Ion: 1.08 mmol/L — ABNORMAL LOW (ref 1.15–1.40)
Chloride: 101 mmol/L (ref 98–111)
Creatinine, Ser: 0.6 mg/dL — ABNORMAL LOW (ref 0.61–1.24)
Glucose, Bld: 137 mg/dL — ABNORMAL HIGH (ref 70–99)
HCT: 32 % — ABNORMAL LOW (ref 39.0–52.0)
Hemoglobin: 10.9 g/dL — ABNORMAL LOW (ref 13.0–17.0)
Potassium: 3.5 mmol/L (ref 3.5–5.1)
Sodium: 138 mmol/L (ref 135–145)
TCO2: 23 mmol/L (ref 22–32)

## 2025-01-18 LAB — CBC
HCT: 31 % — ABNORMAL LOW (ref 39.0–52.0)
Hemoglobin: 10.6 g/dL — ABNORMAL LOW (ref 13.0–17.0)
MCH: 30.6 pg (ref 26.0–34.0)
MCHC: 34.2 g/dL (ref 30.0–36.0)
MCV: 89.6 fL (ref 80.0–100.0)
Platelets: 359 10*3/uL (ref 150–400)
RBC: 3.46 MIL/uL — ABNORMAL LOW (ref 4.22–5.81)
RDW: 13.2 % (ref 11.5–15.5)
WBC: 21.9 10*3/uL — ABNORMAL HIGH (ref 4.0–10.5)
nRBC: 0 % (ref 0.0–0.2)

## 2025-01-18 LAB — TYPE AND SCREEN
ABO/RH(D): A POS
Antibody Screen: NEGATIVE

## 2025-01-18 LAB — I-STAT CG4 LACTIC ACID, ED: Lactic Acid, Venous: 1.9 mmol/L (ref 0.5–1.9)

## 2025-01-18 LAB — PROTIME-INR
INR: 1.2 (ref 0.8–1.2)
Prothrombin Time: 15.8 s — ABNORMAL HIGH (ref 11.4–15.2)

## 2025-01-18 LAB — CK: Total CK: 457 U/L — ABNORMAL HIGH (ref 49–397)

## 2025-01-18 LAB — HIV ANTIBODY (ROUTINE TESTING W REFLEX): HIV Screen 4th Generation wRfx: NONREACTIVE

## 2025-01-18 LAB — SAMPLE TO BLOOD BANK

## 2025-01-18 LAB — ETHANOL: Alcohol, Ethyl (B): <15 mg/dL

## 2025-01-18 LAB — MRSA NEXT GEN BY PCR, NASAL: MRSA by PCR Next Gen: NOT DETECTED

## 2025-01-18 MED ORDER — HYDROMORPHONE HCL 1 MG/ML IJ SOLN
INTRAMUSCULAR | Status: AC
  Filled 2025-01-18: qty 0.5

## 2025-01-18 MED ORDER — HYDROMORPHONE HCL 1 MG/ML IJ SOLN
0.5000 mg | INTRAMUSCULAR | Status: DC | PRN
  Administered 2025-01-18 – 2025-01-19 (×2): 0.5 mg via INTRAVENOUS
  Filled 2025-01-18 (×2): qty 1

## 2025-01-18 MED ORDER — DOCUSATE SODIUM 100 MG PO CAPS
100.0000 mg | ORAL_CAPSULE | Freq: Two times a day (BID) | ORAL | Status: AC
  Administered 2025-01-18 – 2025-01-21 (×7): 100 mg via ORAL
  Filled 2025-01-18 (×7): qty 1

## 2025-01-18 MED ORDER — ONDANSETRON HCL 4 MG/2ML IJ SOLN
INTRAMUSCULAR | Status: AC
  Filled 2025-01-18: qty 2

## 2025-01-18 MED ORDER — METHOCARBAMOL 500 MG PO TABS
500.0000 mg | ORAL_TABLET | Freq: Three times a day (TID) | ORAL | Status: DC
  Administered 2025-01-18 – 2025-01-19 (×2): 500 mg via ORAL
  Filled 2025-01-18 (×2): qty 1

## 2025-01-18 MED ORDER — MIDAZOLAM HCL 2 MG/2ML IJ SOLN
INTRAMUSCULAR | Status: AC
  Filled 2025-01-18: qty 2

## 2025-01-18 MED ORDER — ONDANSETRON HCL 4 MG/2ML IJ SOLN
INTRAMUSCULAR | Status: DC | PRN
  Administered 2025-01-18: 4 mg via INTRAVENOUS

## 2025-01-18 MED ORDER — HYDROMORPHONE HCL 1 MG/ML IJ SOLN
0.5000 mg | INTRAMUSCULAR | Status: DC | PRN
  Administered 2025-01-18: 0.5 mg via INTRAVENOUS
  Filled 2025-01-18 (×2): qty 1

## 2025-01-18 MED ORDER — ACETAMINOPHEN 500 MG PO TABS
1000.0000 mg | ORAL_TABLET | Freq: Four times a day (QID) | ORAL | Status: AC
  Administered 2025-01-18 – 2025-01-21 (×14): 1000 mg via ORAL
  Filled 2025-01-18 (×14): qty 2

## 2025-01-18 MED ORDER — PHENYLEPHRINE 80 MCG/ML (10ML) SYRINGE FOR IV PUSH (FOR BLOOD PRESSURE SUPPORT)
PREFILLED_SYRINGE | INTRAVENOUS | Status: AC
  Filled 2025-01-18: qty 20

## 2025-01-18 MED ORDER — SUCCINYLCHOLINE CHLORIDE 200 MG/10ML IV SOSY
PREFILLED_SYRINGE | INTRAVENOUS | Status: DC | PRN
  Administered 2025-01-18: 120 mg via INTRAVENOUS

## 2025-01-18 MED ORDER — FENTANYL CITRATE (PF) 250 MCG/5ML IJ SOLN
INTRAMUSCULAR | Status: DC | PRN
  Administered 2025-01-18: 100 ug via INTRAVENOUS

## 2025-01-18 MED ORDER — CHLORHEXIDINE GLUCONATE 0.12 % MT SOLN: 15.0000 mL | Freq: Once | OROMUCOSAL | Status: AC

## 2025-01-18 MED ORDER — SUCCINYLCHOLINE CHLORIDE 200 MG/10ML IV SOSY
PREFILLED_SYRINGE | INTRAVENOUS | Status: AC
  Filled 2025-01-18: qty 10

## 2025-01-18 MED ORDER — IOHEXOL 350 MG/ML SOLN
75.0000 mL | Freq: Once | INTRAVENOUS | Status: AC | PRN
  Administered 2025-01-18: 75 mL via INTRAVENOUS

## 2025-01-18 MED ORDER — FENTANYL CITRATE (PF) 50 MCG/ML IJ SOSY
75.0000 ug | PREFILLED_SYRINGE | Freq: Once | INTRAMUSCULAR | Status: AC
  Administered 2025-01-18: 75 ug via INTRAVENOUS
  Filled 2025-01-18: qty 2

## 2025-01-18 MED ORDER — TETANUS-DIPHTH-ACELL PERTUSSIS 5-2-15.5 LF-MCG/0.5 IM SUSP
0.5000 mL | Freq: Once | INTRAMUSCULAR | Status: AC
  Administered 2025-01-18: 0.5 mL via INTRAMUSCULAR
  Filled 2025-01-18: qty 0.5

## 2025-01-18 MED ORDER — SODIUM CHLORIDE 0.9 % IV SOLN
1000.0000 mL | INTRAVENOUS | Status: DC
  Administered 2025-01-18: 1000 mL via INTRAVENOUS

## 2025-01-18 MED ORDER — FENTANYL CITRATE (PF) 100 MCG/2ML IJ SOLN
INTRAMUSCULAR | Status: AC
  Filled 2025-01-18: qty 2

## 2025-01-18 MED ORDER — CEFAZOLIN SODIUM-DEXTROSE 1-4 GM/50ML-% IV SOLN: 1.0000 g | Freq: Once | INTRAVENOUS | Status: DC

## 2025-01-18 MED ORDER — CEFAZOLIN SODIUM-DEXTROSE 2-4 GM/100ML-% IV SOLN
2.0000 g | Freq: Three times a day (TID) | INTRAVENOUS | Status: AC
  Administered 2025-01-18 – 2025-01-19 (×3): 2 g via INTRAVENOUS
  Filled 2025-01-18 (×3): qty 100

## 2025-01-18 MED ORDER — ROCURONIUM BROMIDE 10 MG/ML (PF) SYRINGE
PREFILLED_SYRINGE | INTRAVENOUS | Status: AC
  Filled 2025-01-18: qty 10

## 2025-01-18 MED ORDER — DEXAMETHASONE SOD PHOSPHATE PF 10 MG/ML IJ SOLN
INTRAMUSCULAR | Status: DC | PRN
  Administered 2025-01-18: 10 mg via INTRAVENOUS

## 2025-01-18 MED ORDER — AMISULPRIDE (ANTIEMETIC) 5 MG/2ML IV SOLN: 10.0000 mg | Freq: Once | INTRAVENOUS | Status: DC | PRN

## 2025-01-18 MED ORDER — OXYCODONE HCL 5 MG PO TABS
5.0000 mg | ORAL_TABLET | ORAL | Status: AC | PRN
  Administered 2025-01-18: 5 mg via ORAL
  Filled 2025-01-18: qty 1

## 2025-01-18 MED ORDER — HYDROMORPHONE HCL 1 MG/ML IJ SOLN
INTRAMUSCULAR | Status: AC
  Filled 2025-01-18: qty 1

## 2025-01-18 MED ORDER — METHOCARBAMOL 750 MG PO TABS: 750.0000 mg | ORAL_TABLET | Freq: Three times a day (TID) | ORAL | Status: DC

## 2025-01-18 MED ORDER — PROPOFOL 10 MG/ML IV BOLUS
INTRAVENOUS | Status: DC | PRN
  Administered 2025-01-18: 150 mg via INTRAVENOUS

## 2025-01-18 MED ORDER — DEXAMETHASONE SOD PHOSPHATE PF 10 MG/ML IJ SOLN
INTRAMUSCULAR | Status: AC
  Filled 2025-01-18: qty 1

## 2025-01-18 MED ORDER — CHLORHEXIDINE GLUCONATE CLOTH 2 % EX PADS
6.0000 | MEDICATED_PAD | Freq: Every day | CUTANEOUS | Status: AC
  Administered 2025-01-19 – 2025-01-21 (×3): 6 via TOPICAL

## 2025-01-18 MED ORDER — HYDROMORPHONE HCL 1 MG/ML IJ SOLN
INTRAMUSCULAR | Status: DC | PRN
  Administered 2025-01-18 (×2): .25 mg via INTRAVENOUS

## 2025-01-18 MED ORDER — LIDOCAINE 2% (20 MG/ML) 5 ML SYRINGE
INTRAMUSCULAR | Status: AC
  Filled 2025-01-18: qty 5

## 2025-01-18 MED ORDER — OXYCODONE HCL 5 MG PO TABS: 5.0000 mg | ORAL_TABLET | Freq: Once | ORAL | Status: DC | PRN

## 2025-01-18 MED ORDER — OXYCODONE HCL 5 MG/5ML PO SOLN: 5.0000 mg | Freq: Once | ORAL | Status: DC | PRN

## 2025-01-18 MED ORDER — ONDANSETRON HCL 4 MG/2ML IJ SOLN: 4.0000 mg | Freq: Four times a day (QID) | INTRAMUSCULAR | Status: AC | PRN

## 2025-01-18 MED ORDER — ONDANSETRON HCL 4 MG/2ML IJ SOLN: 4.0000 mg | Freq: Four times a day (QID) | INTRAMUSCULAR | Status: DC | PRN

## 2025-01-18 MED ORDER — SENNA 8.6 MG PO TABS
1.0000 | ORAL_TABLET | Freq: Two times a day (BID) | ORAL | Status: AC
  Administered 2025-01-18 – 2025-01-21 (×7): 8.6 mg via ORAL
  Filled 2025-01-18 (×7): qty 1

## 2025-01-18 MED ORDER — METHOCARBAMOL 1000 MG/10ML IJ SOLN
500.0000 mg | Freq: Three times a day (TID) | INTRAMUSCULAR | Status: DC
  Administered 2025-01-18: 500 mg via INTRAVENOUS
  Filled 2025-01-18: qty 10

## 2025-01-18 MED ORDER — DEXMEDETOMIDINE HCL IN NACL 80 MCG/20ML IV SOLN
INTRAVENOUS | Status: AC
  Filled 2025-01-18: qty 20

## 2025-01-18 MED ORDER — CHLORHEXIDINE GLUCONATE 0.12 % MT SOLN
OROMUCOSAL | Status: AC
  Administered 2025-01-18: 15 mL via OROMUCOSAL
  Filled 2025-01-18: qty 15

## 2025-01-18 MED ORDER — PREGABALIN 75 MG PO CAPS: 75.0000 mg | ORAL_CAPSULE | Freq: Two times a day (BID) | ORAL | Status: DC

## 2025-01-18 MED ORDER — GABAPENTIN 300 MG PO CAPS
300.0000 mg | ORAL_CAPSULE | Freq: Three times a day (TID) | ORAL | Status: DC
  Administered 2025-01-18 – 2025-01-21 (×9): 300 mg via ORAL
  Filled 2025-01-18 (×10): qty 1

## 2025-01-18 MED ORDER — PROPOFOL 10 MG/ML IV BOLUS
INTRAVENOUS | Status: AC
  Filled 2025-01-18: qty 20

## 2025-01-18 MED ORDER — SUGAMMADEX SODIUM 200 MG/2ML IV SOLN
INTRAVENOUS | Status: DC | PRN
  Administered 2025-01-18: 200 mg via INTRAVENOUS

## 2025-01-18 MED ORDER — MIDAZOLAM HCL (PF) 2 MG/2ML IJ SOLN
INTRAMUSCULAR | Status: DC | PRN
  Administered 2025-01-18: 2 mg via INTRAVENOUS

## 2025-01-18 MED ORDER — ONDANSETRON HCL 4 MG/2ML IJ SOLN
INTRAMUSCULAR | Status: AC
  Administered 2025-01-18: 4 mg
  Filled 2025-01-18: qty 2

## 2025-01-18 MED ORDER — ACETAMINOPHEN 325 MG PO TABS: 650.0000 mg | ORAL_TABLET | Freq: Four times a day (QID) | ORAL | Status: DC

## 2025-01-18 MED ORDER — METOPROLOL TARTRATE 5 MG/5ML IV SOLN: 5.0000 mg | Freq: Four times a day (QID) | INTRAVENOUS | Status: AC | PRN

## 2025-01-18 MED ORDER — LACTATED RINGERS IV SOLN: INTRAVENOUS | Status: DC

## 2025-01-18 MED ORDER — ONDANSETRON 4 MG PO TBDP: 4.0000 mg | ORAL_TABLET | Freq: Four times a day (QID) | ORAL | Status: AC | PRN

## 2025-01-18 MED ORDER — CEFAZOLIN SODIUM-DEXTROSE 2-4 GM/100ML-% IV SOLN
2.0000 g | Freq: Once | INTRAVENOUS | Status: AC
  Administered 2025-01-18: 2 g via INTRAVENOUS
  Filled 2025-01-18: qty 100

## 2025-01-18 MED ORDER — POLYETHYLENE GLYCOL 3350 17 G PO PACK: 17.0000 g | PACK | Freq: Every day | ORAL | Status: DC | PRN

## 2025-01-18 MED ORDER — SODIUM CHLORIDE 0.9 % IV BOLUS (SEPSIS)
1000.0000 mL | Freq: Once | INTRAVENOUS | Status: AC
  Administered 2025-01-18: 1000 mL via INTRAVENOUS

## 2025-01-18 MED ORDER — 0.9 % SODIUM CHLORIDE (POUR BTL) OPTIME
TOPICAL | Status: DC | PRN
  Administered 2025-01-18: 1000 mL

## 2025-01-18 MED ORDER — DEXMEDETOMIDINE HCL IN NACL 80 MCG/20ML IV SOLN
INTRAVENOUS | Status: DC | PRN
  Administered 2025-01-18: 8 ug via INTRAVENOUS
  Administered 2025-01-18: 4 ug via INTRAVENOUS

## 2025-01-18 MED ORDER — ROCURONIUM BROMIDE 10 MG/ML (PF) SYRINGE
PREFILLED_SYRINGE | INTRAVENOUS | Status: DC | PRN
  Administered 2025-01-18: 30 mg via INTRAVENOUS
  Administered 2025-01-18 (×2): 10 mg via INTRAVENOUS

## 2025-01-18 MED ORDER — LEVETIRACETAM (KEPPRA) 500 MG/5 ML ADULT IV PUSH
500.0000 mg | Freq: Two times a day (BID) | INTRAVENOUS | Status: DC
  Administered 2025-01-18 – 2025-01-19 (×3): 500 mg via INTRAVENOUS
  Filled 2025-01-18 (×3): qty 5

## 2025-01-18 MED ORDER — SODIUM CHLORIDE 0.9 % IV SOLN: INTRAVENOUS | Status: DC

## 2025-01-18 MED ORDER — ORAL CARE MOUTH RINSE: 15.0000 mL | Freq: Once | OROMUCOSAL | Status: AC

## 2025-01-18 MED ORDER — PHENYLEPHRINE 80 MCG/ML (10ML) SYRINGE FOR IV PUSH (FOR BLOOD PRESSURE SUPPORT)
PREFILLED_SYRINGE | INTRAVENOUS | Status: DC | PRN
  Administered 2025-01-18: 160 ug via INTRAVENOUS
  Administered 2025-01-18 (×2): 80 ug via INTRAVENOUS
  Administered 2025-01-18 (×4): 160 ug via INTRAVENOUS

## 2025-01-18 MED ORDER — OXYCODONE HCL 5 MG PO TABS
10.0000 mg | ORAL_TABLET | ORAL | Status: AC | PRN
  Administered 2025-01-18 – 2025-01-21 (×14): 10 mg via ORAL
  Filled 2025-01-18 (×14): qty 2

## 2025-01-18 MED ORDER — HYDRALAZINE HCL 20 MG/ML IJ SOLN: 10.0000 mg | INTRAMUSCULAR | Status: AC | PRN

## 2025-01-18 MED ORDER — HYDROMORPHONE HCL 1 MG/ML IJ SOLN
0.2500 mg | INTRAMUSCULAR | Status: DC | PRN
  Administered 2025-01-18 (×2): 0.5 mg via INTRAVENOUS

## 2025-01-18 MED ORDER — OXYCODONE HCL 5 MG PO TABS: 10.0000 mg | ORAL_TABLET | ORAL | Status: DC | PRN

## 2025-01-18 MED ORDER — LIDOCAINE 2% (20 MG/ML) 5 ML SYRINGE
INTRAMUSCULAR | Status: DC | PRN
  Administered 2025-01-18: 60 mg via INTRAVENOUS
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
## 2025-01-18 NOTE — Anesthesia Procedure Notes (Signed)
 Procedure Name: Intubation Date/Time: 01/18/2025 8:26 AM  Performed by: Alen Motto D, CRNAPre-anesthesia Checklist: Patient identified, Emergency Drugs available, Suction available and Patient being monitored Patient Re-evaluated:Patient Re-evaluated prior to induction Oxygen Delivery Method: Circle System Utilized Preoxygenation: Pre-oxygenation with 100% oxygen Induction Type: IV induction Ventilation: Mask ventilation without difficulty Laryngoscope Size: Glidescope and 3 Grade View: Grade I Tube type: Oral Tube size: 7.0 mm Number of attempts: 1 Airway Equipment and Method: Stylet and Oral airway Placement Confirmation: ETT inserted through vocal cords under direct vision, positive ETCO2 and breath sounds checked- equal and bilateral Secured at: 21 cm Tube secured with: Tape Dental Injury: Teeth and Oropharynx as per pre-operative assessment
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
## 2025-01-18 NOTE — Progress Notes (Signed)
 Orthopedic Tech Progress Note Patient Details:  George Li Aug 28, 1991 [id number]  Ortho Devices Type of Ortho Device: Bone foam zero knee Ortho Device/Splint Location: LLE Ortho Device/Splint Interventions: Ordered, Application   Post Interventions Patient Tolerated: Poor Instructions Provided: Care of device  [name] Pac 01/18/2025, 11:57 AM

## 2025-01-19 ENCOUNTER — Encounter (HOSPITAL_COMMUNITY): Payer: Self-pay | Admitting: Orthopedic Surgery

## 2025-01-19 ENCOUNTER — Inpatient Hospital Stay (HOSPITAL_COMMUNITY)

## 2025-01-19 LAB — CBC
HCT: 23.5 % — ABNORMAL LOW (ref 39.0–52.0)
Hemoglobin: 8 g/dL — ABNORMAL LOW (ref 13.0–17.0)
MCH: 30.4 pg (ref 26.0–34.0)
MCHC: 34 g/dL (ref 30.0–36.0)
MCV: 89.4 fL (ref 80.0–100.0)
Platelets: 290 10*3/uL (ref 150–400)
RBC: 2.63 MIL/uL — ABNORMAL LOW (ref 4.22–5.81)
RDW: 13.3 % (ref 11.5–15.5)
WBC: 10.6 10*3/uL — ABNORMAL HIGH (ref 4.0–10.5)
nRBC: 0 % (ref 0.0–0.2)

## 2025-01-19 LAB — COMPREHENSIVE METABOLIC PANEL WITH GFR
ALT: 36 U/L (ref 0–44)
AST: 55 U/L — ABNORMAL HIGH (ref 15–41)
Albumin: 2.5 g/dL — ABNORMAL LOW (ref 3.5–5.0)
Alkaline Phosphatase: 66 U/L (ref 38–126)
Anion gap: 5 (ref 5–15)
BUN: 8 mg/dL (ref 6–20)
CO2: 26 mmol/L (ref 22–32)
Calcium: 8 mg/dL — ABNORMAL LOW (ref 8.9–10.3)
Chloride: 108 mmol/L (ref 98–111)
Creatinine, Ser: 0.47 mg/dL — ABNORMAL LOW (ref 0.61–1.24)
GFR, Estimated: >60 mL/min
Glucose, Bld: 141 mg/dL — ABNORMAL HIGH (ref 70–99)
Potassium: 3.4 mmol/L — ABNORMAL LOW (ref 3.5–5.1)
Sodium: 139 mmol/L (ref 135–145)
Total Bilirubin: 0.4 mg/dL (ref 0.0–1.2)
Total Protein: 5.3 g/dL — ABNORMAL LOW (ref 6.5–8.1)

## 2025-01-19 LAB — VITAMIN D 25 HYDROXY (VIT D DEFICIENCY, FRACTURES): Vit D, 25-Hydroxy: 13 ng/mL — ABNORMAL LOW (ref 30–100)

## 2025-01-19 MED ORDER — SODIUM CHLORIDE 0.9 % IV SOLN: 2.0000 g | INTRAVENOUS | Status: DC

## 2025-01-19 MED ORDER — SODIUM CHLORIDE 0.9 % IV SOLN: INTRAVENOUS | Status: DC

## 2025-01-19 MED ORDER — VITAMIN D 25 MCG (1000 UNIT) PO TABS
1000.0000 [IU] | ORAL_TABLET | Freq: Every day | ORAL | Status: AC
  Administered 2025-01-19 – 2025-01-21 (×3): 1000 [IU] via ORAL
  Filled 2025-01-19 (×3): qty 1

## 2025-01-19 MED ORDER — METHOCARBAMOL 500 MG PO TABS
1000.0000 mg | ORAL_TABLET | Freq: Three times a day (TID) | ORAL | Status: AC
  Administered 2025-01-19 – 2025-01-21 (×8): 1000 mg via ORAL
  Filled 2025-01-19 (×8): qty 2

## 2025-01-19 MED ORDER — SODIUM CHLORIDE 0.9 % IV SOLN
2.0000 g | INTRAVENOUS | Status: AC
  Administered 2025-01-19 – 2025-01-21 (×3): 2 g via INTRAVENOUS
  Filled 2025-01-19 (×3): qty 20

## 2025-01-19 MED ORDER — LEVETIRACETAM 500 MG PO TABS
500.0000 mg | ORAL_TABLET | Freq: Two times a day (BID) | ORAL | Status: AC
  Administered 2025-01-19 – 2025-01-21 (×5): 500 mg via ORAL
  Filled 2025-01-19 (×5): qty 1

## 2025-01-19 MED ORDER — CALCIUM GLUCONATE-NACL 2-0.675 GM/100ML-% IV SOLN
2.0000 g | Freq: Once | INTRAVENOUS | Status: AC
  Administered 2025-01-19: 2000 mg via INTRAVENOUS
  Filled 2025-01-19: qty 100

## 2025-01-19 MED ORDER — METHOCARBAMOL 1000 MG/10ML IJ SOLN
1000.0000 mg | Freq: Three times a day (TID) | INTRAMUSCULAR | Status: DC
  Administered 2025-01-19: 1000 mg via INTRAVENOUS
  Filled 2025-01-19: qty 10

## 2025-01-19 MED ORDER — POTASSIUM CHLORIDE 20 MEQ PO PACK
40.0000 meq | PACK | Freq: Once | ORAL | Status: AC
  Administered 2025-01-19: 40 meq via ORAL
  Filled 2025-01-19: qty 2

## 2025-01-19 MED ORDER — TRAMADOL HCL 50 MG PO TABS
50.0000 mg | ORAL_TABLET | Freq: Four times a day (QID) | ORAL | Status: AC
  Administered 2025-01-19 – 2025-01-21 (×10): 50 mg via ORAL
  Filled 2025-01-19 (×10): qty 1

## 2025-01-19 MED ORDER — ALBUMIN HUMAN 5 % IV SOLN
12.5000 g | Freq: Once | INTRAVENOUS | Status: AC
  Administered 2025-01-19: 12.5 g via INTRAVENOUS
  Filled 2025-01-19: qty 250

## 2025-01-19 MED ORDER — POTASSIUM CHLORIDE IN NACL 20-0.9 MEQ/L-% IV SOLN
INTRAVENOUS | Status: DC
  Filled 2025-01-19 (×2): qty 1000

## 2025-01-19 MED ORDER — HYDROMORPHONE HCL 1 MG/ML IJ SOLN
1.0000 mg | INTRAMUSCULAR | Status: AC | PRN
  Administered 2025-01-19 – 2025-01-21 (×19): 1 mg via INTRAVENOUS
  Filled 2025-01-19 (×20): qty 1

## 2025-01-19 MED ORDER — MELATONIN 3 MG PO TABS
3.0000 mg | ORAL_TABLET | Freq: Every evening | ORAL | Status: AC | PRN
  Administered 2025-01-19: 3 mg via ORAL
  Filled 2025-01-19: qty 1
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
## 2025-01-19 NOTE — Plan of Care (Signed)
" °  Problem: Education: Goal: Knowledge of General Education information will improve Description: Including pain rating scale, medication(s)/side effects and non-pharmacologic comfort measures Outcome: Not Progressing   Problem: Health Behavior/Discharge Planning: Goal: Ability to manage health-related needs will improve Outcome: Not Progressing   Problem: Clinical Measurements: Goal: Ability to maintain clinical measurements within normal limits will improve Outcome: Not Progressing Goal: Will remain free from infection Outcome: Not Progressing Goal: Diagnostic test results will improve Outcome: Not Progressing Goal: Respiratory complications will improve Outcome: Not Progressing Goal: Cardiovascular complication will be avoided Outcome: Not Progressing   Problem: Activity: Goal: Risk for activity intolerance will decrease Outcome: Progressing   Problem: Nutrition: Goal: Adequate nutrition will be maintained Outcome: Progressing   Problem: Coping: Goal: Level of anxiety will decrease Outcome: Not Progressing   Problem: Elimination: Goal: Will not experience complications related to bowel motility Outcome: Not Progressing Goal: Will not experience complications related to urinary retention Outcome: Not Progressing   Problem: Pain Managment: Goal: General experience of comfort will improve and/or be controlled Outcome: Not Progressing   Problem: Safety: Goal: Ability to remain free from injury will improve Outcome: Not Progressing   Problem: Skin Integrity: Goal: Risk for impaired skin integrity will decrease Outcome: Not Progressing   Problem: Education: Goal: Knowledge of the prescribed therapeutic regimen will improve Outcome: Not Progressing   Problem: Bowel/Gastric: Goal: Gastrointestinal status for postoperative course will improve Outcome: Not Progressing   Problem: Cardiac: Goal: Ability to maintain an adequate cardiac output Outcome:  Progressing Goal: Will show no evidence of cardiac arrhythmias Outcome: Progressing   Problem: Nutritional: Goal: Will attain and maintain optimal nutritional status Outcome: Progressing   Problem: Neurological: Goal: Will regain or maintain usual level of consciousness Outcome: Progressing   Problem: Clinical Measurements: Goal: Ability to maintain clinical measurements within normal limits Outcome: Not Progressing Goal: Postoperative complications will be avoided or minimized Outcome: Not Progressing   Problem: Respiratory: Goal: Will regain and/or maintain adequate ventilation Outcome: Progressing Goal: Respiratory status will improve Outcome: Not Progressing   Problem: Skin Integrity: Goal: Demonstrates signs of wound healing without infection Outcome: Not Progressing   Problem: Urinary Elimination: Goal: Will remain free from infection Outcome: Progressing Goal: Ability to achieve and maintain adequate urine output Outcome: Not Progressing   "
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
## 2025-01-19 NOTE — Progress Notes (Addendum)
 2/4 0320: Patient complaining of being restless, unable to get comfortable, fluctuating between feeling hot ad cold and appeared anxious at 0320, scheduled Robaxin  given with some relief. During patient transport to CT scan patient admits to daily uses of OxyContin  and heroin. Pt reports using 20-30 mg OxyContin  2-3 times per day and Heroin 4-5 times per day with last use just prior to his arrival to the Emergency Room. RN inquired from patient if he has taken any non-hospital provided medication or street drugs, patient declines stating him and his significant other know better.  2/4 9349: RN observing on room cameras the patients significant other Reida Ee removing an object from her bag and walk over to hand the patient the object underneath a blanket. RN went to room to assess situation and found patient clutching the object denying her was handed anything. RN obtained object which was a plastic bag with Meth residue per significant other. Significant other to leave hospital and not return until pt is to be discharged, security called and came to room to witness disposal of bag in the sharps container.

## 2025-01-19 NOTE — Discharge Instructions (Signed)
 In a time of Crisis: Integrated family Services/Therapeutic Alternatives.  Mobile Crisis Management provides immediate crisis response, 24/7.  Call [phone] Depending on the county.  [facility] for MH/DD/SA [facility] is available 24 hours a day, 7 days a week. Customer Service Specialists will assist you to find a crisis provider that is well-matched with your needs. Your local number is: [phone]  [facility] ([facility]) IOP, individual counseling, medication management [phone] [place], [place] [address] [phone] Call for intake hours; Medicaid and Uninsured    Outpatient Providers  [facility] ([facility]) Group and individual counseling. [address]  [place], [place] [address] [phone] [place]: [phone]  [place]: [phone] Medicaid and uninsured.   [facility] Offers IOP groups multiple times per week. [address] [place], [place] [address] [phone] Takes Medicaid and other insurances.   [name]  Chemical Dependency Intensive Outpatient Program (IOP) [address] [place], [place] [address] [phone] Takes Nurse, learning disability and [place].   [facility]  IOP and Partial Hospitalization Program  [address]  [place], [place] [address] [phone] Private Insurance, [place] only for partial hospitalization  [facility] [address], [place], [place] [address] [phone] Monday-Friday. Short and Long term options. [facility] ([facility]) IOP, individual counseling, medication management [address] [place], [place] [address] [phone] Medicaid and [facility]  [facility] [address]  [place], [place] [address] [phone] Private Insurance and Self Pay    [facility] [address]  [place], [place] [address] [phone] Private Insurance, [place], and Self Pay   [facility]  [address], [place] [address] [facility]  [address]  [place], [place] [address] [phone]  [facility]  [address] [place], [place] [address] [phone]  [facility]  Locations in [place], [place], [place], [place].  [phone] Accepts all insurances and some uninsured.   Residential Treatment Programs  [facility] [address] [place], [place] [address] [phone] or [phone] Detox and Residential Rehab 21 days (Medicaid, private insurance, and self pay. If Medicare, will look into funding). No methadone. Call for pre-screen.   [facility]) [address]  [place], [place] [address] [phone] Detox 3-7 days (self Pay and Medicaid Limited). Transitional Program for females needs 60 days clean first.  Rehab Only for Males 60 days (Medicare, and Self Pay)-No methadone.  [facility] [address] [place], [place] [address] [phone] or [phone] Private Insurance only  [facility] PHONE: [phone] FAX: [phone] Residential program for women 21 and over for up to a year through a Christian 12-step recovery model. Self-pay.    [facility] [address] [place], [place] [address] Phone:  [phone] Must be detoxed 72 hours prior to admission; 28 day program.  Self-pay.  [facility] [address]  [place], [place] [phone] [employer], Medicare, [place] (not straight [place]). They offer assistance with transportation.   [facility] [address] [place],  [place], [place] [address] [phone] Christian Based Program. Men only. No insurance  [facility] [address] [place], [place] [address] Women's: [phone] Men's: [phone] No Medicaid.   [facility]  [address]  [place], [place] [address] [phone] Treatment Only, must make assessment appointment, and must be sober for assessment appointment. Self pay, [facility], must be [facility] resident. No methadone.   [facility]  [address] [place], [place] [address] [phone] No pending legal charges, Long-term work program. No methadone. Call for assessment.  [facility]  [address], [place], [place] [address] [phone] or [phone] Commercial Insurance Only  [facility] - [phone] Private Insurance (no [place]). Males/Females, call to make referrals, multiple facilities.   [facility] [address]  [place], [place] [address]  [phone] Men Only Upfront Fee  [facility]-no methadone: [facility] [address] [place], [place] [address] [phone] ([phone] (f)  [facility] Locations across the [place] (mainly [place] ) willing to help with transportation.  [phone] [employer].     AA Meetings Meeting Locator:  [email or web address] Also can download an app on that website.  [facility] Program Due to COVID-19, syringe services programs are likely operating under different hours with limited or no fixed site hours. Some programs may not be operating at all. Please contact the program directly using the phone numbers provided below to see if they are still operating under COVID-19. [facility] Solution to the Opioid Problem (GCSTOP) Fixed; mobile; peer-based [place] [phone] [email or web address] Fixed site exchange at [facility], [address] [place], [place] [address] on Wednesdays (2:00 - 5:00 pm) and Thursdays  (4:00 - 8:00 pm). Pop-up mobile exchange locations: [employer], [address] [place], [place] [address] on Tuesdays (11:00 am - 1:00 pm) and Fridays (11:00 am - 1:00 pm) -[facility] - [address], [place], [place] [address] on Tuesdays (2:00 - 4:00 pm) and Fridays (2:00 - 4:00 pm) -[facility] -Fixed; mobile; peer-based [phone] [address], [place], [place] [address] Delivery and outreach available in [place] and [place], please call for more information. Monday, Tuesday: 1:00 -7:00 pm, Thursday: 4:00 pm - 8:00 pm, Friday: 1:00 pm - 8:00 pm) Medication-Assisted Treatment (MAT) -[facility]: Locations in [place], [place], [place], [place]. Accepts all insurances and some uninsured. Methadone, Suboxone, etc. Closed on weekends. Walk ins before 11am.  Call [phone]  -[facility]- services [place] and surrounding areas including [place], [place], [place], [place], [address], [address], [place], [place], [place], and [place], [place]. Options include Methadone, buprenorphine or Suboxone. [address], [place], [place] [address] Phone: [phone] [place] - Fri: 5:30am - 2:00pm, Sat: 5:30am -7:30am, Sun: Closed  -[facility]- We use FDA-approved medications, like methadone/suboxone/sublocade, and vivitrol. These medications are then combined with customized care plans that include individual or group counseling, toxicology, and medical care directed by on-site physicians. Accepts most insurance plans, Medicaid, and private pay.  [address] [place], [place] [address] Phone: [phone] Monday-Friday 5:00 AM - 10:00 AM, Saturday 6:00 AM - 8:30 AM, Sunday 6:00 AM - 7:00 AM  -[facility]- [facility] is a treatment & recovery focused program. In addition to receiving methadone medication, our clients participate in individual and group counseling as well as random  drug testing. If accepted into the [facility] Opioid Program, you will be provided several intake appointments and a physical exam [address] [place], [place] [address] Office: [phone]  Fax: [phone]  -[facility]- We put our community members at the  center of everything we do, for remote treatment services as well as in-person, from alcohol withdrawal to opioid use and more.  [address], [place], [place] [address] [phone] Monday-Wednesday: 9:00am - 5:00pm, Thursday: 9:00am - 6:00pm, Friday: 9:00am - 5:00pm Saturday: 9:00am - 1:00pm, Sunday: Closed  -[facility]- Our clinic in [place], a medication unit, offers daily dosing of methadone or buprenorphine in addition to our clinic in [place] offering counseling to help people overcome addiction to heroin and other opiates. We also offer psychiatric services including medication management, and an office based suboxone program. [address], [place], [place] [address] Phone [phone]  [facility]-We treat Opioid Addiction using medications that are a combination of buprenorphine and naloxone, which are used to treat adults addicted to narcotic painkillers and drugs such as heroin. They reduce the intense cravings and painful symptoms that accompany withdrawal. [address], [place], [place] Phone: [phone]  -[facility] (Or [place]): $12/daily for Methadone Treatment.  [address], [place], [place] [address] [phone] [address] [place], [place] [address]  -[facility]: Suboxone only.  [address], [place], [place] [address] P. [phone]  -[facility] [address] [place], [place] [address] P. [phone]
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
## 2025-01-20 LAB — CBC
HCT: 23.5 % — ABNORMAL LOW (ref 39.0–52.0)
Hemoglobin: 8 g/dL — ABNORMAL LOW (ref 13.0–17.0)
MCH: 30.2 pg (ref 26.0–34.0)
MCHC: 34 g/dL (ref 30.0–36.0)
MCV: 88.7 fL (ref 80.0–100.0)
Platelets: 307 10*3/uL (ref 150–400)
RBC: 2.65 MIL/uL — ABNORMAL LOW (ref 4.22–5.81)
RDW: 13.4 % (ref 11.5–15.5)
WBC: 8.4 10*3/uL (ref 4.0–10.5)
nRBC: 0 % (ref 0.0–0.2)

## 2025-01-20 LAB — BASIC METABOLIC PANEL WITH GFR
Anion gap: 7 (ref 5–15)
BUN: 6 mg/dL (ref 6–20)
CO2: 23 mmol/L (ref 22–32)
Calcium: 8.2 mg/dL — ABNORMAL LOW (ref 8.9–10.3)
Chloride: 107 mmol/L (ref 98–111)
Creatinine, Ser: 0.42 mg/dL — ABNORMAL LOW (ref 0.61–1.24)
GFR, Estimated: >60 mL/min
Glucose, Bld: 88 mg/dL (ref 70–99)
Potassium: 3.8 mmol/L (ref 3.5–5.1)
Sodium: 138 mmol/L (ref 135–145)

## 2025-01-20 LAB — PHOSPHORUS: Phosphorus: 2.6 mg/dL (ref 2.5–4.6)

## 2025-01-20 LAB — MAGNESIUM: Magnesium: 1.8 mg/dL (ref 1.7–2.4)

## 2025-01-20 MED ORDER — ENOXAPARIN SODIUM 30 MG/0.3ML IJ SOSY
30.0000 mg | PREFILLED_SYRINGE | Freq: Two times a day (BID) | INTRAMUSCULAR | Status: AC
  Administered 2025-01-21 (×2): 30 mg via SUBCUTANEOUS
  Filled 2025-01-20 (×2): qty 0.3
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
## 2025-01-20 NOTE — Progress Notes (Signed)
 Orthopedic Tech Progress Note Patient Details:  George Li 03/18/1991 [id number]  Ortho Devices Type of Ortho Device: Prafo boot/shoe Ortho Device/Splint Location: LLE/ spoke to nurse will place in after bone foam. Ortho Device/Splint Interventions: Ordered   Post Interventions Patient Tolerated: Well Instructions Provided: Care of device  Adine MARLA Blush 01/20/2025, 2:33 PM
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
## 2025-01-21 LAB — BASIC METABOLIC PANEL WITH GFR
Anion gap: 7 (ref 5–15)
BUN: 9 mg/dL (ref 6–20)
CO2: 24 mmol/L (ref 22–32)
Calcium: 8.2 mg/dL — ABNORMAL LOW (ref 8.9–10.3)
Chloride: 106 mmol/L (ref 98–111)
Creatinine, Ser: 0.49 mg/dL — ABNORMAL LOW (ref 0.61–1.24)
GFR, Estimated: >60 mL/min
Glucose, Bld: 93 mg/dL (ref 70–99)
Potassium: 3.7 mmol/L (ref 3.5–5.1)
Sodium: 137 mmol/L (ref 135–145)

## 2025-01-21 LAB — CBC
HCT: 27.3 % — ABNORMAL LOW (ref 39.0–52.0)
Hemoglobin: 9.2 g/dL — ABNORMAL LOW (ref 13.0–17.0)
MCH: 29.9 pg (ref 26.0–34.0)
MCHC: 33.7 g/dL (ref 30.0–36.0)
MCV: 88.6 fL (ref 80.0–100.0)
Platelets: 333 10*3/uL (ref 150–400)
RBC: 3.08 MIL/uL — ABNORMAL LOW (ref 4.22–5.81)
RDW: 13.4 % (ref 11.5–15.5)
WBC: 8.5 10*3/uL (ref 4.0–10.5)
nRBC: 0 % (ref 0.0–0.2)

## 2025-01-21 MED ORDER — NAPROXEN 250 MG PO TABS: 500.0000 mg | ORAL_TABLET | Freq: Two times a day (BID) | ORAL | Status: AC | PRN

## 2025-01-21 MED ORDER — CLONIDINE HCL 0.1 MG PO TABS: 0.1000 mg | ORAL_TABLET | ORAL | Status: AC

## 2025-01-21 MED ORDER — POLYETHYLENE GLYCOL 3350 17 G PO PACK
17.0000 g | PACK | Freq: Every day | ORAL | Status: AC
  Administered 2025-01-21: 17 g via ORAL
  Filled 2025-01-21: qty 1

## 2025-01-21 MED ORDER — HYDROXYZINE HCL 25 MG PO TABS: 25.0000 mg | ORAL_TABLET | Freq: Four times a day (QID) | ORAL | Status: AC | PRN

## 2025-01-21 MED ORDER — CLONIDINE HCL 0.1 MG PO TABS
0.1000 mg | ORAL_TABLET | Freq: Four times a day (QID) | ORAL | Status: AC
  Administered 2025-01-21 (×3): 0.1 mg via ORAL
  Filled 2025-01-21 (×3): qty 1

## 2025-01-21 MED ORDER — DICYCLOMINE HCL 20 MG PO TABS: 20.0000 mg | ORAL_TABLET | Freq: Four times a day (QID) | ORAL | Status: AC | PRN

## 2025-01-21 MED ORDER — CLONIDINE HCL 0.1 MG PO TABS: 0.1000 mg | ORAL_TABLET | Freq: Every day | ORAL | Status: AC

## 2025-01-21 MED ORDER — GABAPENTIN 300 MG PO CAPS
600.0000 mg | ORAL_CAPSULE | Freq: Three times a day (TID) | ORAL | Status: AC
  Administered 2025-01-21 (×2): 600 mg via ORAL
  Filled 2025-01-21 (×2): qty 2

## 2025-01-21 MED ORDER — MAGNESIUM CITRATE PO SOLN
0.5000 | Freq: Once | ORAL | Status: AC
  Filled 2025-01-21: qty 296
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
## 2025-01-21 NOTE — Plan of Care (Signed)

## 2025-01-21 NOTE — Progress Notes (Signed)
 Pt arrived from 4N via bed with wound vac to left tib-fib. Plug wound vac to wall, place call button, and urinal in patient reach. VSS at this time.   Pt requested to hold his magnesium  citrate because he feels like he can go on his own.
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
## 2025-01-25 ENCOUNTER — Encounter (HOSPITAL_COMMUNITY): Admission: EM | Payer: Self-pay | Source: Home / Self Care
